# Patient Record
Sex: Female | Born: 1995 | Race: White | Hispanic: No | Marital: Single | Smoking: Never smoker
Health system: Southern US, Community
[De-identification: ages and names within clinical notes are randomized; demographics above are authoritative.]

---
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
## 2015-07-08 IMAGING — CR DG LUMBAR SPINE COMPLETE 4+V
5 series · 5 of 5 positions shown · non-contrast
Comparison: None.

CLINICAL DATA: Thrown from horse, now with low back pain

EXAM:
LUMBAR SPINE - COMPLETE 4+ VIEW

[t lumbar spine ap]
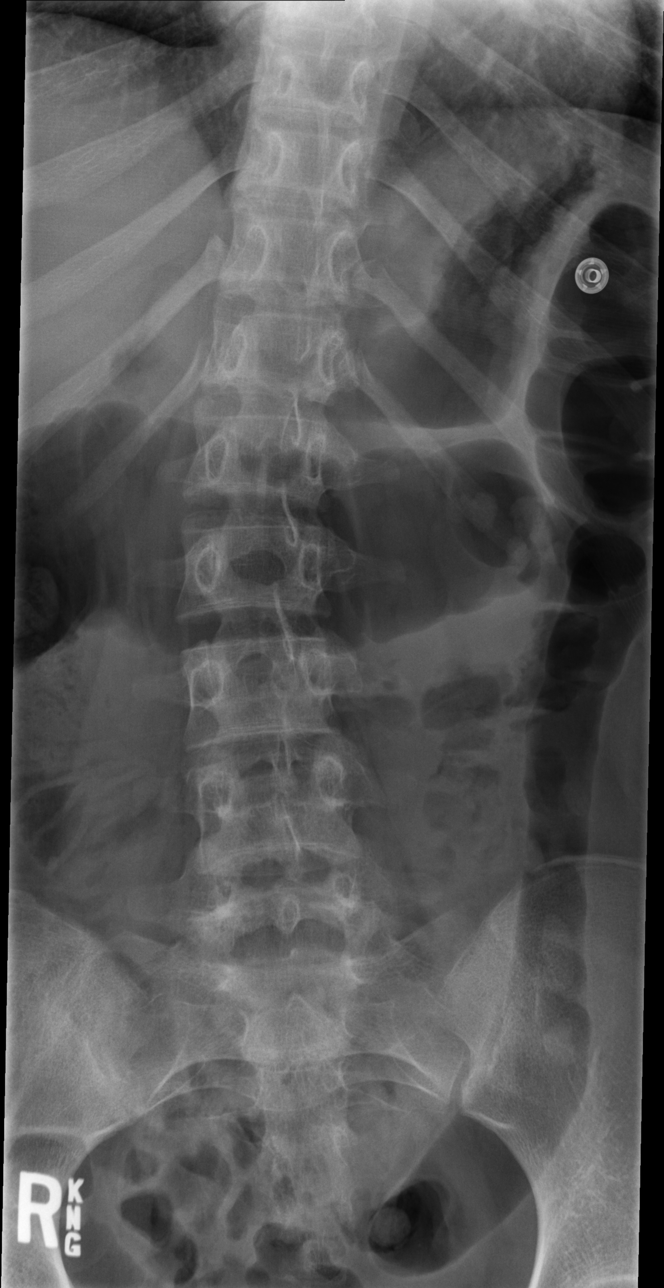

[t lumbar spine obl (1 of 2)]
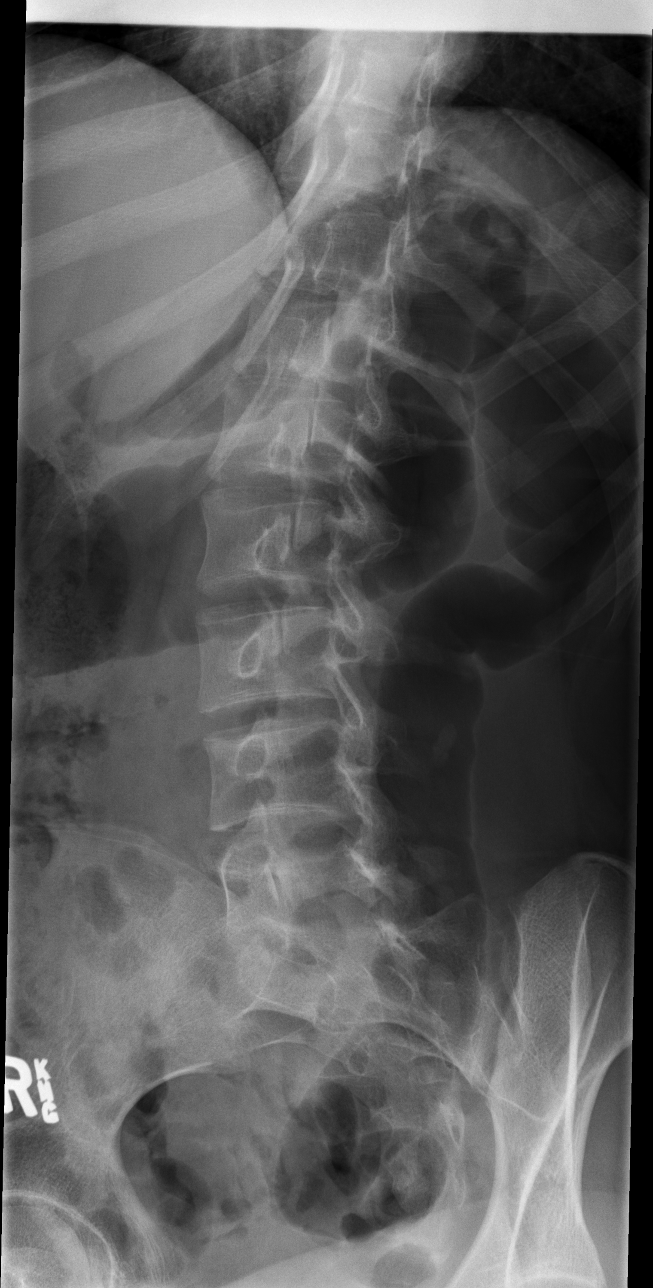

[t lumbar spine obl (2 of 2)]
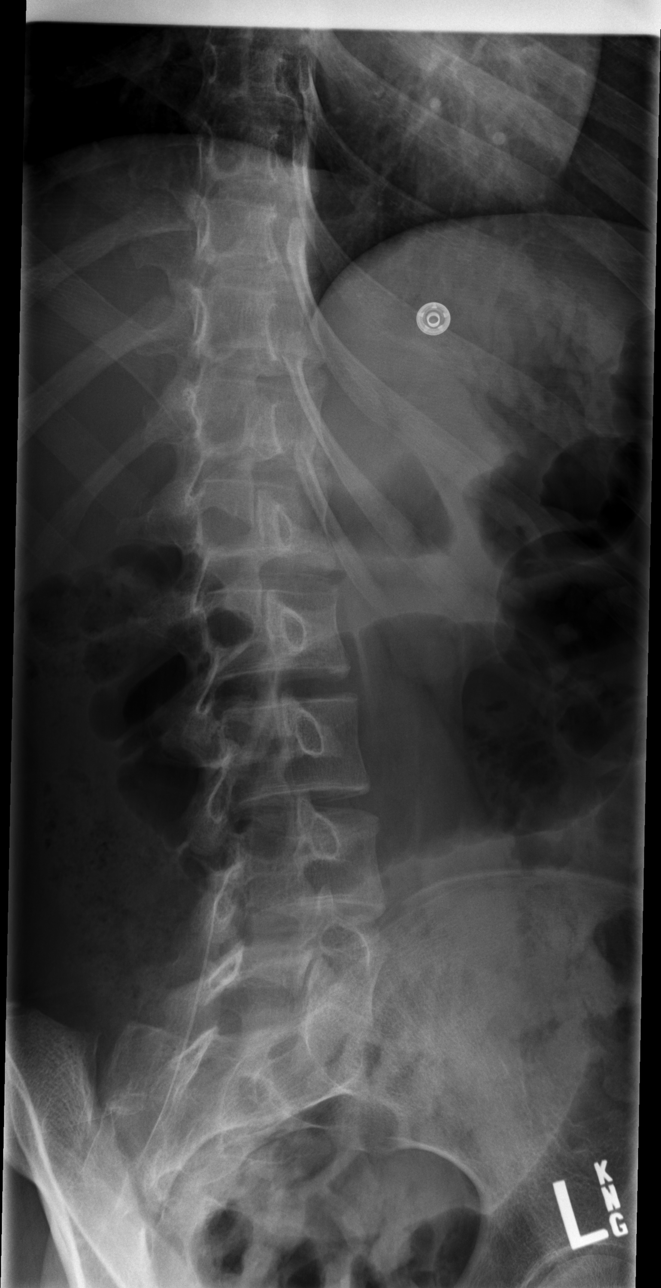

[t lumbar spine lat]
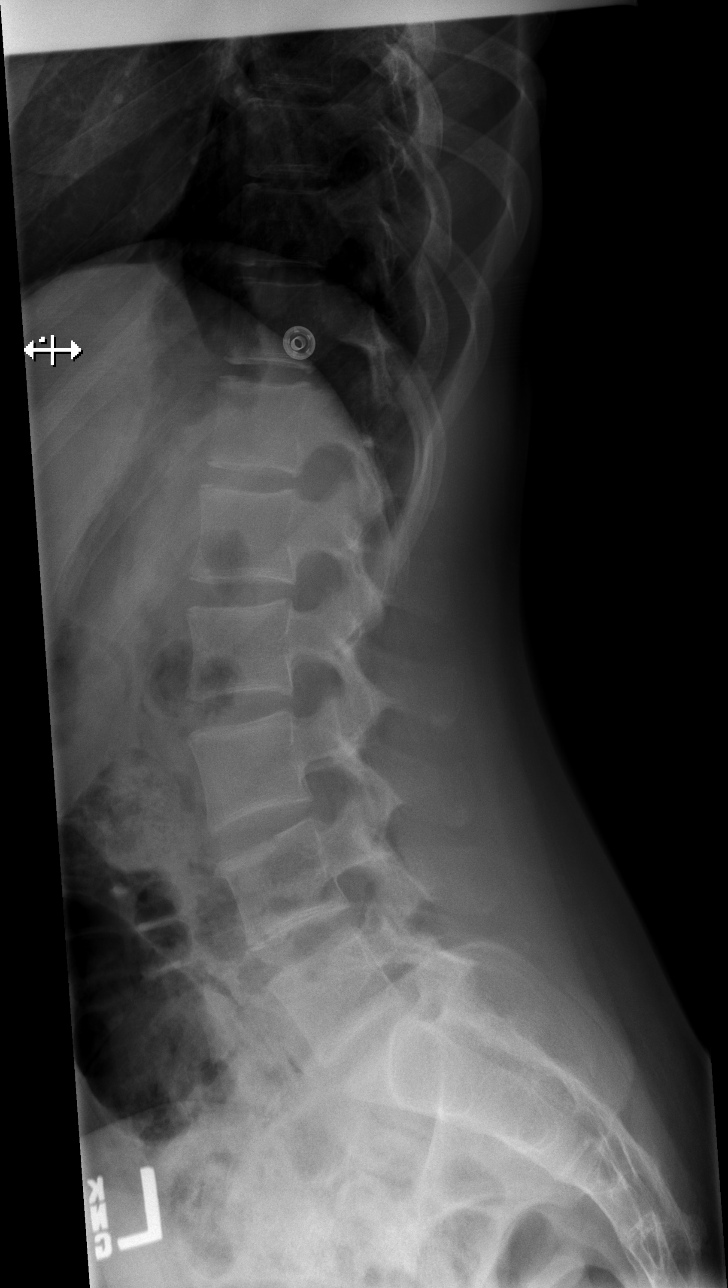

[t lumbar l-5 s-1 spot]
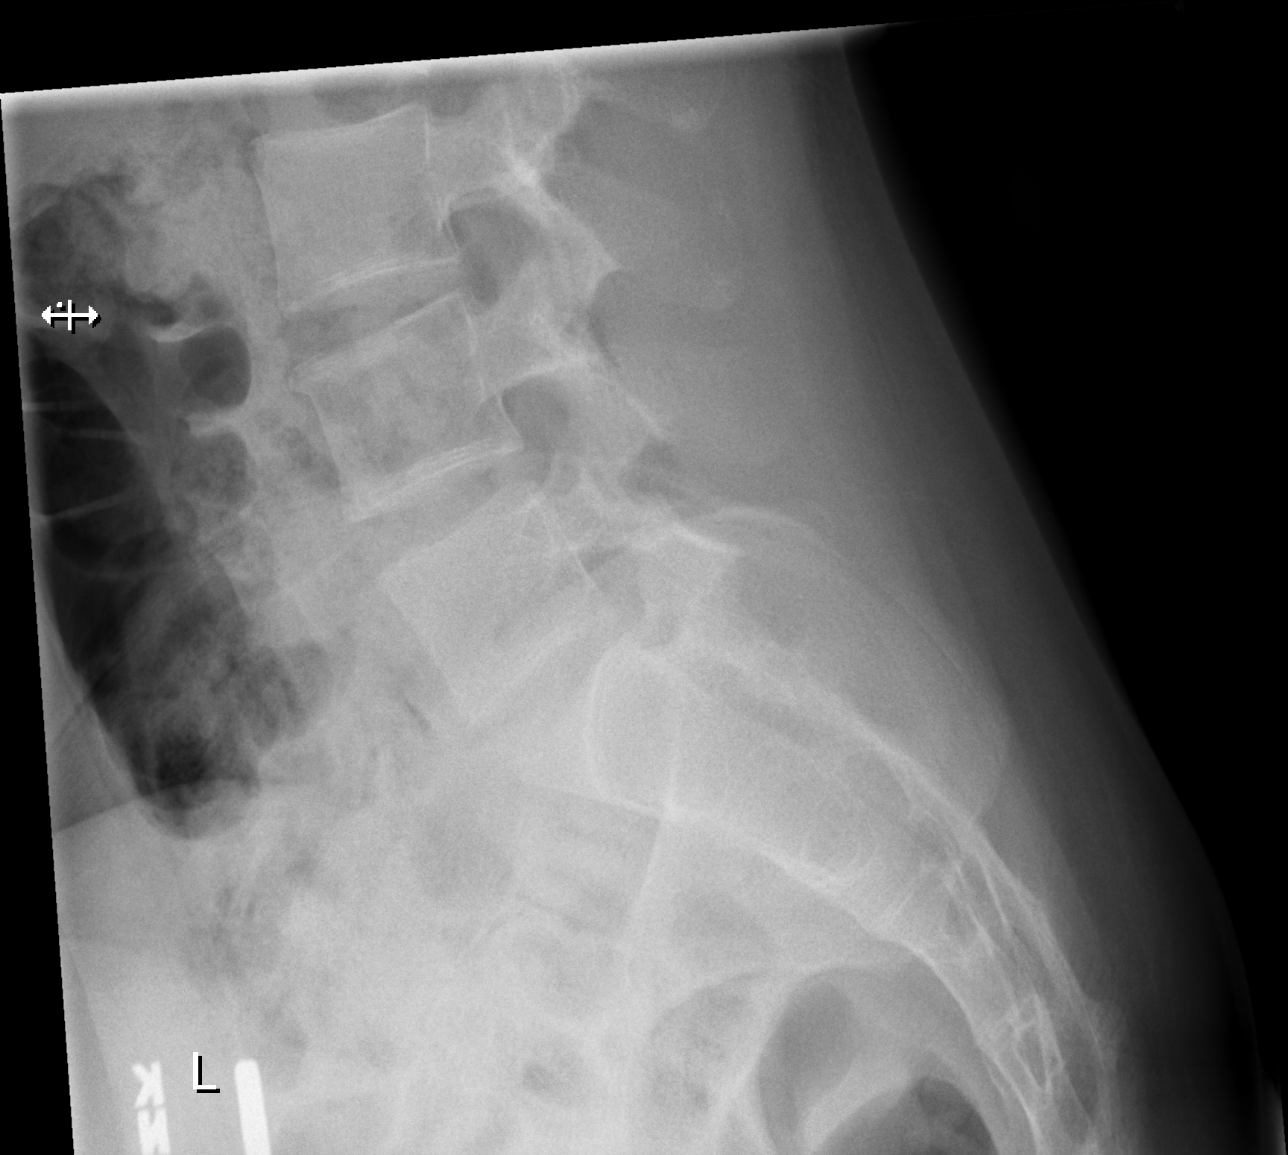

[5 of 5 positions shown; findings below may reference images not displayed]

FINDINGS: There are 5 non rib-bearing lumbar type vertebral bodies.

There is an age-indeterminate mild scoliotic curvature of the
thoracolumbar spine with anterior curvature convex to the right. No
anterolisthesis or retrolisthesis. No definite pars defects.

There is a mild (under 10%) focal age indeterminate compression
deformity involving the anterior aspect of the superior endplate of
the L4 vertebral body. No definite retropulsion. Remaining vertebral
body heights appear preserved. Limbus bodies are incidentally noted
about the anterior superior and inferior endplates of several
additional thoracic and upper lumbar vertebral bodies.

Intervertebral disc spaces appear preserved.

Limited visualization of the bilateral SI joints is normal. The
descending colon appears mildly featureless, likely secondary to
underdistention. Regional bowel gas pattern is otherwise normal.
Regional soft tissues are normal.
IMPRESSION: 1. Mild (under 10%) focal age-indeterminate compression deformity
involving the anterior aspect of the superior endplate of the L4
vertebral body. Correlation for point tenderness at this location is
recommended.
2. Mild age-indeterminate scoliotic curvature of the thoracolumbar
spine.

## 2019-05-22 ENCOUNTER — Ambulatory Visit: Payer: Managed Care, Other (non HMO) | Attending: Internal Medicine

## 2019-05-22 DIAGNOSIS — Z20822 Contact with and (suspected) exposure to covid-19: Secondary | ICD-10-CM

## 2019-05-24 LAB — NOVEL CORONAVIRUS, NAA: SARS-CoV-2, NAA: NOT DETECTED
